# Patient Record
Sex: Male | Born: 1975 | Race: Black or African American | Hispanic: No | Marital: Single | Smoking: Current every day smoker
Health system: Southern US, Community
[De-identification: ages and names within clinical notes are randomized; demographics above are authoritative.]

## PROBLEM LIST (undated history)

## (undated) DIAGNOSIS — M545 Low back pain, unspecified: Secondary | ICD-10-CM

## (undated) DIAGNOSIS — G8929 Other chronic pain: Secondary | ICD-10-CM

## (undated) HISTORY — PX: HERNIA REPAIR: SHX51

---

## 2009-03-05 ENCOUNTER — Emergency Department (HOSPITAL_COMMUNITY): Admission: EM | Admit: 2009-03-05 | Discharge: 2009-03-05 | Payer: Self-pay | Admitting: Emergency Medicine

## 2009-03-31 ENCOUNTER — Emergency Department (HOSPITAL_COMMUNITY): Admission: EM | Admit: 2009-03-31 | Discharge: 2009-03-31 | Payer: Self-pay | Admitting: Emergency Medicine

## 2009-08-22 ENCOUNTER — Emergency Department (HOSPITAL_COMMUNITY): Admission: EM | Admit: 2009-08-22 | Discharge: 2009-08-22 | Payer: Self-pay | Admitting: Emergency Medicine

## 2009-09-06 ENCOUNTER — Emergency Department (HOSPITAL_COMMUNITY): Admission: EM | Admit: 2009-09-06 | Discharge: 2009-09-06 | Payer: Self-pay | Admitting: Emergency Medicine

## 2010-01-11 ENCOUNTER — Emergency Department (HOSPITAL_COMMUNITY): Admission: EM | Admit: 2010-01-11 | Discharge: 2010-01-11 | Payer: Self-pay | Admitting: Emergency Medicine

## 2010-02-14 ENCOUNTER — Emergency Department (HOSPITAL_COMMUNITY): Admission: EM | Admit: 2010-02-14 | Discharge: 2010-02-14 | Payer: Self-pay | Admitting: Emergency Medicine

## 2010-03-24 ENCOUNTER — Emergency Department (HOSPITAL_COMMUNITY)
Admission: EM | Admit: 2010-03-24 | Discharge: 2010-03-24 | Payer: Self-pay | Source: Home / Self Care | Admitting: Emergency Medicine

## 2011-07-25 ENCOUNTER — Emergency Department (HOSPITAL_COMMUNITY)
Admission: EM | Admit: 2011-07-25 | Discharge: 2011-07-25 | Disposition: A | Discharge status: Home / Self Care | Payer: Self-pay | Attending: Emergency Medicine | Admitting: Emergency Medicine

## 2011-07-25 ENCOUNTER — Encounter (HOSPITAL_COMMUNITY): Payer: Self-pay | Admitting: *Deleted

## 2011-07-25 DIAGNOSIS — R5383 Other fatigue: Secondary | ICD-10-CM | POA: Insufficient documentation

## 2011-07-25 DIAGNOSIS — H9209 Otalgia, unspecified ear: Secondary | ICD-10-CM | POA: Insufficient documentation

## 2011-07-25 DIAGNOSIS — R05 Cough: Secondary | ICD-10-CM | POA: Insufficient documentation

## 2011-07-25 DIAGNOSIS — K089 Disorder of teeth and supporting structures, unspecified: Secondary | ICD-10-CM | POA: Insufficient documentation

## 2011-07-25 DIAGNOSIS — F17200 Nicotine dependence, unspecified, uncomplicated: Secondary | ICD-10-CM | POA: Insufficient documentation

## 2011-07-25 DIAGNOSIS — J3489 Other specified disorders of nose and nasal sinuses: Secondary | ICD-10-CM | POA: Insufficient documentation

## 2011-07-25 DIAGNOSIS — R6889 Other general symptoms and signs: Secondary | ICD-10-CM | POA: Insufficient documentation

## 2011-07-25 DIAGNOSIS — IMO0001 Reserved for inherently not codable concepts without codable children: Secondary | ICD-10-CM | POA: Insufficient documentation

## 2011-07-25 DIAGNOSIS — Z79899 Other long term (current) drug therapy: Secondary | ICD-10-CM | POA: Insufficient documentation

## 2011-07-25 DIAGNOSIS — R5381 Other malaise: Secondary | ICD-10-CM | POA: Insufficient documentation

## 2011-07-25 DIAGNOSIS — R6883 Chills (without fever): Secondary | ICD-10-CM | POA: Insufficient documentation

## 2011-07-25 DIAGNOSIS — K0889 Other specified disorders of teeth and supporting structures: Secondary | ICD-10-CM

## 2011-07-25 DIAGNOSIS — R059 Cough, unspecified: Secondary | ICD-10-CM | POA: Insufficient documentation

## 2011-07-25 LAB — BASIC METABOLIC PANEL
CO2: 22 mEq/L (ref 19–32)
Calcium: 8.7 mg/dL (ref 8.4–10.5)
Creatinine, Ser: 1.03 mg/dL (ref 0.50–1.35)
GFR calc non Af Amer: >90 mL/min (ref >90)
Glucose, Bld: 98 mg/dL (ref 70–99)

## 2011-07-25 LAB — CBC
MCH: 22.9 pg — ABNORMAL LOW (ref 26.0–34.0)
MCHC: 33.5 g/dL (ref 30.0–36.0)
MCV: 68.4 fL — ABNORMAL LOW (ref 78.0–100.0)
Platelets: 205 10*3/uL (ref 150–400)
RBC: 5.45 MIL/uL (ref 4.22–5.81)

## 2011-07-25 LAB — POCT I-STAT, CHEM 8
Creatinine, Ser: 1.2 mg/dL (ref 0.50–1.35)
Glucose, Bld: 98 mg/dL (ref 70–99)
HCT: 43 % (ref 39.0–52.0)
Hemoglobin: 14.6 g/dL (ref 13.0–17.0)
Sodium: 140 mEq/L (ref 135–145)
TCO2: 27 mmol/L (ref 0–100)

## 2011-07-25 MED ORDER — ONDANSETRON HCL 4 MG PO TABS: 4.0000 mg | ORAL_TABLET | Freq: Four times a day (QID) | ORAL | Status: AC

## 2011-07-25 MED ORDER — SODIUM CHLORIDE 0.9 % IV BOLUS (SEPSIS)
1000.0000 mL | Freq: Once | INTRAVENOUS | Status: AC
  Administered 2011-07-25: 1000 mL via INTRAVENOUS

## 2011-07-25 MED ORDER — OXYCODONE-ACETAMINOPHEN 5-325 MG PO TABS
1.0000 | ORAL_TABLET | Freq: Once | ORAL | Status: AC
  Administered 2011-07-25: 1 via ORAL
  Filled 2011-07-25: qty 1

## 2011-07-25 MED ORDER — ONDANSETRON HCL 4 MG/2ML IJ SOLN
4.0000 mg | Freq: Once | INTRAMUSCULAR | Status: AC
  Administered 2011-07-25: 4 mg via INTRAVENOUS
  Filled 2011-07-25: qty 2

## 2011-07-25 MED ORDER — DIPHENOXYLATE-ATROPINE 2.5-0.025 MG PO TABS: 1.0000 | ORAL_TABLET | Freq: Four times a day (QID) | ORAL | Status: AC | PRN

## 2011-07-25 MED ORDER — ACETAMINOPHEN 325 MG PO TABS
650.0000 mg | ORAL_TABLET | Freq: Once | ORAL | Status: AC
  Administered 2011-07-25: 650 mg via ORAL
  Filled 2011-07-25: qty 2

## 2011-07-25 MED ORDER — OXYCODONE-ACETAMINOPHEN 5-325 MG PO TABS: 2.0000 | ORAL_TABLET | ORAL | Status: AC | PRN

## 2011-07-25 MED ORDER — PENICILLIN V POTASSIUM 500 MG PO TABS: 500.0000 mg | ORAL_TABLET | Freq: Three times a day (TID) | ORAL | Status: AC

## 2011-07-25 MED ORDER — DIPHENOXYLATE-ATROPINE 2.5-0.025 MG PO TABS
1.0000 | ORAL_TABLET | Freq: Once | ORAL | Status: AC
  Administered 2011-07-25: 1 via ORAL
  Filled 2011-07-25: qty 1

## 2011-07-25 NOTE — ED Notes (Signed)
Pt stated that he has been having n/v/d x 1 week. He is also complaining of body aches and fever. Pt stated that he [facility] and they diagnosed with tachycardia. No respiratory or cardiac distress. Will continue to monitor.

## 2011-07-25 NOTE — ED Notes (Signed)
The pt has had  2 liters of iv fluid and is ready for c/c according to he fnp

## 2011-07-25 NOTE — Discharge Instructions (Signed)
Mr. Haubner he had flulike symptoms today. We treated your dehydration with fluid in the ER today we treated her nausea with Zofran. We treated sure diarrhea with Lomotil. Giving you prescriptions for all this medication. New PCP from the list below to followup with. Turned to the ER for uncontrolled fever nausea vomiting and diarrhea.  RESOURCE GUIDE  Dental Problems  Patients with Medicaid: [facility] [phone]                                           [phone] [address] [employer] Phone:  [phone]                                                  Phone:  [phone]  If unable to pay or uninsured, contact:  [facility] or [facility]. to become qualified for the adult dental clinic.  Chronic Pain Problems Contact [facility]  [phone] Patients need to be referred by their primary care doctor.  Insufficient Money for Medicine Contact [employer]:  call "211" or [facility] [phone].  No Primary Care Doctor Call [facility]  [phone] Other agencies that provide inexpensive medical care    [facility]  [phone]    [facility]  [phone]    [facility]  [phone]    [facility]  [phone]    [facility]  [phone]    [facility]  [phone]  Psychological Services [facility]  [phone] [facility]  [phone] [facility]   [phone] (emergency services [phone])  Substance Abuse Resources [facility]  [phone] [facility] [phone] [facility] [place] [phone] Floydene Flock [phone] Residential & Outpatient Substance Abuse Program  [phone]  Abuse/Neglect [facility] Child Abuse Hotline [phone] [facility] Child Abuse Hotline [phone] (After Hours)  Emergency Shelter [facility] [phone]  Maternity Homes Room at the [place] [facility] [phone] [facility] Services [phone]  MRSA Hotline #:   248-594-4641    [facility] Resources  [facility]     [employer]                          [facility] Dept. [address] [place]                       [address]      [address]  [place]                                                Cristobal Goldmann Phone:  [phone]  Phone:  [phone]                 Phone:  [phone]  [facility] Phone:  [phone]  [facility] Child Abuse Hotline ([phone] (After Hours)

## 2011-07-25 NOTE — ED Notes (Signed)
Pt reports that he has had N/V/D x 1 week with cough.  Reports body aches.  Denies fever.  Pt went to health dept and was told that he had tachycardia.

## 2011-07-25 NOTE — ED Notes (Signed)
Pt sleeping. 

## 2011-07-26 NOTE — ED Provider Notes (Signed)
History     CSN: [id number]  Arrival date & time 07/25/11  1451   First MD Initiated Contact with Patient 07/25/11 1807      Chief Complaint  Patient presents with  . Emesis    (Consider location/radiation/quality/duration/timing/severity/associated sxs/prior treatment) Patient is a 36 y.o. male presenting with vomiting.  Emesis  This is a new problem. The current episode started more than 2 days ago. The problem occurs 5 to 10 times per day. The problem has not changed since onset.The emesis has an appearance of stomach contents. There has been no fever. Associated symptoms include chills, cough, diarrhea, headaches and sweats. Pertinent negatives include no abdominal pain and no fever. Risk factors include ill contacts.  Reports n/v/d for > 1 week.  Has taken theraflu, robitussin,and alka selzer with no relief.  Also c/o L upper incisor tooth pain x 3 days.  No pcp.  Sick contacts at home.   History reviewed. No pertinent past medical history.  History reviewed. No pertinent past surgical history.  History reviewed. No pertinent family history.  History  Substance Use Topics  . Smoking status: Current Everyday Smoker -- 2.0 packs/day  . Smokeless tobacco: Not on file  . Alcohol Use: Yes      Review of Systems  Constitutional: Positive for chills and fatigue. Negative for fever.  HENT: Positive for ear pain, congestion and rhinorrhea. Negative for neck pain and neck stiffness.   Eyes: Negative.   Respiratory: Positive for cough.   Cardiovascular: Negative for chest pain.  Gastrointestinal: Positive for vomiting and diarrhea. Negative for abdominal pain and blood in stool.  Genitourinary: Negative for difficulty urinating.  Musculoskeletal: Negative for back pain.  Neurological: Positive for headaches. Negative for dizziness and light-headedness.  Psychiatric/Behavioral: Negative.     Allergies  Review of patient's allergies indicates no known allergies.  Home  Medications   Current Outpatient Rx  Name Route Sig Dispense Refill  . ALLERGY PO Oral Take 1 tablet by mouth daily as needed. For allergies.    Marland Kitchen GUAIFENESIN 100 MG/5ML PO LIQD Oral Take 200 mg by mouth 3 (three) times daily as needed. For cough.    Juanita Laster COLD & COUGH PO Oral Take 10 mLs by mouth daily as needed. For cold symptoms.    Marland Kitchen DIPHENOXYLATE-ATROPINE 2.5-0.025 MG PO TABS Oral Take 1 tablet by mouth 4 (four) times daily as needed for diarrhea or loose stools. 30 tablet 0  . ONDANSETRON HCL 4 MG PO TABS Oral Take 1 tablet (4 mg total) by mouth every 6 (six) hours. 12 tablet 0  . OXYCODONE-ACETAMINOPHEN 5-325 MG PO TABS Oral Take 2 tablets by mouth every 4 (four) hours as needed for pain. 15 tablet 0  . PENICILLIN V POTASSIUM 500 MG PO TABS Oral Take 1 tablet (500 mg total) by mouth 3 (three) times daily. 30 tablet 0    BP 116/72  Pulse 82  Temp(Src) 98.9 F (37.2 C) (Oral)  Resp 18  SpO2 98%  Physical Exam  Nursing note and vitals reviewed. Constitutional: He is oriented to person, place, and time. He appears well-developed and well-nourished.  HENT:  Head: Normocephalic.  Eyes: Conjunctivae and EOM are normal. Pupils are equal, round, and reactive to light.  Neck: Normal range of motion. Neck supple.  Cardiovascular: Normal rate.   Pulmonary/Chest: Effort normal.  Abdominal: Soft. Bowel sounds are normal. He exhibits no distension. There is no tenderness. There is no rebound and no guarding.  Musculoskeletal: Normal range of  motion. He exhibits tenderness.       General body aches on assessment  Neurological: He is alert and oriented to person, place, and time.  Skin: Skin is warm and dry. No rash noted.  Psychiatric: He has a normal mood and affect.    ED Course  Procedures (including critical care time)  Labs Reviewed  CBC - Abnormal; Notable for the following:    Hemoglobin 12.5 (*)    HCT 37.3 (*)    MCV 68.4 (*)    MCH 22.9 (*)    All other components  within normal limits  POCT I-STAT, CHEM 8 - Abnormal; Notable for the following:    Calcium, Ion 1.08 (*)    All other components within normal limits  BASIC METABOLIC PANEL  LAB REPORT - SCANNED   No results found.   1. Flu-like symptoms   2. Toothache       MDM  N/V/D x 1 week with toothache.  1L NS in the ER with zofran and lomotil.  Tylenol for pain.  Rx for oxycodone and pcn lomotil and zofran.   Get pcp to follow up with or return here. Feels better and ready for discharge.         Jethro Bastos, NP 07/26/11 [phone]

## 2011-07-26 NOTE — ED Provider Notes (Signed)
Medical screening examination/treatment/procedure(s) were performed by non-physician practitioner and as supervising physician I was immediately available for consultation/collaboration.  Toy Baker, MD 07/26/11 [phone]

## 2013-12-22 ENCOUNTER — Emergency Department (HOSPITAL_COMMUNITY): Payer: Self-pay

## 2013-12-22 ENCOUNTER — Emergency Department (HOSPITAL_COMMUNITY)
Admission: EM | Admit: 2013-12-22 | Discharge: 2013-12-22 | Disposition: A | Discharge status: Home / Self Care | Payer: Self-pay | Attending: Emergency Medicine | Admitting: Emergency Medicine

## 2013-12-22 ENCOUNTER — Encounter (HOSPITAL_COMMUNITY): Payer: Self-pay | Admitting: Emergency Medicine

## 2013-12-22 DIAGNOSIS — G8929 Other chronic pain: Secondary | ICD-10-CM | POA: Insufficient documentation

## 2013-12-22 DIAGNOSIS — M545 Low back pain, unspecified: Secondary | ICD-10-CM | POA: Insufficient documentation

## 2013-12-22 DIAGNOSIS — Z87828 Personal history of other (healed) physical injury and trauma: Secondary | ICD-10-CM | POA: Insufficient documentation

## 2013-12-22 DIAGNOSIS — F17200 Nicotine dependence, unspecified, uncomplicated: Secondary | ICD-10-CM | POA: Insufficient documentation

## 2013-12-22 DIAGNOSIS — M79609 Pain in unspecified limb: Secondary | ICD-10-CM | POA: Insufficient documentation

## 2013-12-22 HISTORY — DX: Low back pain, unspecified: M54.50

## 2013-12-22 HISTORY — DX: Other chronic pain: G89.29

## 2013-12-22 HISTORY — DX: Low back pain: M54.5

## 2013-12-22 LAB — URINALYSIS, ROUTINE W REFLEX MICROSCOPIC
Bilirubin Urine: NEGATIVE
GLUCOSE, UA: NEGATIVE mg/dL
HGB URINE DIPSTICK: NEGATIVE
Ketones, ur: NEGATIVE mg/dL
LEUKOCYTES UA: NEGATIVE
Nitrite: NEGATIVE
Protein, ur: NEGATIVE mg/dL
SPECIFIC GRAVITY, URINE: 1.016 (ref 1.005–1.030)
UROBILINOGEN UA: 1 mg/dL (ref 0.0–1.0)
pH: 6.5 (ref 5.0–8.0)

## 2013-12-22 LAB — CBC WITH DIFFERENTIAL/PLATELET
BASOS PCT: 1 % (ref 0–1)
Basophils Absolute: 0.1 10*3/uL (ref 0.0–0.1)
EOS PCT: 6 % — AB (ref 0–5)
Eosinophils Absolute: 0.3 10*3/uL (ref 0.0–0.7)
HEMATOCRIT: 37.3 % — AB (ref 39.0–52.0)
HEMOGLOBIN: 13 g/dL (ref 13.0–17.0)
LYMPHS ABS: 1.3 10*3/uL (ref 0.7–4.0)
LYMPHS PCT: 26 % (ref 12–46)
MCH: 23.9 pg — ABNORMAL LOW (ref 26.0–34.0)
MCHC: 34.9 g/dL (ref 30.0–36.0)
MCV: 68.7 fL — AB (ref 78.0–100.0)
MONOS PCT: 7 % (ref 3–12)
Monocytes Absolute: 0.4 10*3/uL (ref 0.1–1.0)
NEUTROS ABS: 3 10*3/uL (ref 1.7–7.7)
Neutrophils Relative %: 60 % (ref 43–77)
Platelets: 289 10*3/uL (ref 150–400)
RBC: 5.43 MIL/uL (ref 4.22–5.81)
RDW: 14.7 % (ref 11.5–15.5)
Smear Review: ADEQUATE
WBC: 5.1 10*3/uL (ref 4.0–10.5)

## 2013-12-22 LAB — BASIC METABOLIC PANEL
Anion gap: 14 (ref 5–15)
BUN: 11 mg/dL (ref 6–23)
CHLORIDE: 99 meq/L (ref 96–112)
CO2: 28 meq/L (ref 19–32)
Calcium: 8.9 mg/dL (ref 8.4–10.5)
Creatinine, Ser: 1.08 mg/dL (ref 0.50–1.35)
GFR calc Af Amer: >90 mL/min (ref >90)
GFR calc non Af Amer: 84 mL/min — ABNORMAL LOW (ref >90)
GLUCOSE: 99 mg/dL (ref 70–99)
POTASSIUM: 3.5 meq/L — AB (ref 3.7–5.3)
SODIUM: 141 meq/L (ref 137–147)

## 2013-12-22 MED ORDER — SODIUM CHLORIDE 0.9 % IV BOLUS (SEPSIS)
1000.0000 mL | Freq: Once | INTRAVENOUS | Status: AC
  Administered 2013-12-22: 1000 mL via INTRAVENOUS

## 2013-12-22 MED ORDER — MORPHINE SULFATE 4 MG/ML IJ SOLN
6.0000 mg | Freq: Once | INTRAMUSCULAR | Status: AC
  Administered 2013-12-22: 6 mg via INTRAVENOUS
  Filled 2013-12-22: qty 2

## 2013-12-22 MED ORDER — HYDROCODONE-ACETAMINOPHEN 5-325 MG PO TABS: 1.0000 | ORAL_TABLET | Freq: Two times a day (BID) | ORAL | Status: DC | PRN

## 2013-12-22 MED ORDER — HYDROCODONE-ACETAMINOPHEN 5-325 MG PO TABS
2.0000 | ORAL_TABLET | Freq: Once | ORAL | Status: AC
  Administered 2013-12-22: 2 via ORAL
  Filled 2013-12-22: qty 2
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
## 2013-12-22 NOTE — ED Notes (Addendum)
Pt still in room, slowly getting dressed

## 2013-12-22 NOTE — Discharge Instructions (Signed)
Back Pain, Adult  Jordan Rush, you were seen for worsening of your chronic back pain.  You can take pain medication at home as needed and follow up with orthopaedic surgery for continued care.  If your symptoms worsen, or you develop numbness/tingling, or you urinate/have bowel movements on yourself, return to the ED immediately for repeat evaluation.  Thank you. Back pain is very common. The pain often gets better over time. The cause of back pain is usually not dangerous. Most people can learn to manage their back pain on their own.  HOME CARE   Stay active. Start with short walks on flat ground if you can. Try to walk farther each day.  Do not sit, drive, or stand in one place for more than 30 minutes. Do not stay in bed.  Do not avoid exercise or work. Activity can help your back heal faster.  Be careful when you bend or lift an object. Bend at your knees, keep the object close to you, and do not twist.  Sleep on a firm mattress. Lie on your side, and bend your knees. If you lie on your back, put a pillow under your knees.  Only take medicines as told by your doctor.  Put ice on the injured area.  Put ice in a plastic bag.  Place a towel between your skin and the bag.  Leave the ice on for 15-20 minutes, 03-04 times a day for the first 2 to 3 days. After that, you can switch between ice and heat packs.  Ask your doctor about back exercises or massage.  Avoid feeling anxious or stressed. Find good ways to deal with stress, such as exercise. GET HELP RIGHT AWAY IF:   Your pain does not go away with rest or medicine.  Your pain does not go away in 1 week.  You have new problems.  You do not feel well.  The pain spreads into your legs.  You cannot control when you poop (bowel movement) or pee (urinate).  Your arms or legs feel weak or lose feeling (numbness).  You feel sick to your stomach (nauseous) or throw up (vomit).  You have belly (abdominal) pain.  You feel  like you may pass out (faint). MAKE SURE YOU:   Understand these instructions.  Will watch your condition.  Will get help right away if you are not doing well or get worse. Document Released: 09/28/2007 Document Revised: 07/04/2011 Document Reviewed: 08/13/2013 [facility] Patient Information 2015 [name], [place]. This information is not intended to replace advice given to you by your health care provider. Make sure you discuss any questions you have with your health care provider.   Back Injury Prevention The following tips can help you to prevent a back injury. PHYSICAL FITNESS  Exercise often. Try to develop strong stomach (abdominal) muscles.  Do aerobic exercises often. This includes walking, jogging, biking, swimming.  Do exercises that help with balance and strength often. This includes tai chi and yoga.  Stretch before and after you exercise.  Keep a healthy weight. DIET   Ask your doctor how much calcium and vitamin D you need every day.  Include calcium in your diet. Foods high in calcium include dairy products; green, leafy vegetables; and products with calcium added (fortified).  Include vitamin D in your diet. Foods high in vitamin D include milk and products with vitamin D added.  Think about taking a multivitamin or other nutritional products called " supplements."  Stop smoking if you  smoke. POSTURE   Sit and stand up straight. Avoid leaning forward or hunching over.  Choose chairs that support your lower back.  If you work at a desk:  Sit close to your work so you do not lean over.  Keep your chin tucked in.  Keep your neck drawn back.  Keep your elbows bent at a right angle. Your arms should look like the letter "L."  Sit high and close to the steering wheel when you drive. Add low back support to your car seat if needed.  Avoid sitting or standing in one position for too long. Get up and move around every hour. Take breaks if you are driving for a  long time.  Sleep on your side with your knees slightly bent. You can also sleep on your back with a pillow under your knees. Do not sleep on your stomach. LIFTING, TWISTING, AND REACHING  Avoid heavy lifting, especially lifting over and over again. If you must do heavy lifting:  Stretch before lifting.  Work slowly.  Rest between lifts.  Use carts and dollies to move objects when possible.  Make several small trips instead of carrying 1 heavy load.  Ask for help when you need it.  Ask for help when moving big, awkward objects.  Follow these steps when lifting:  Stand with your feet shoulder-width apart.  Get as close to the object as you can. Do not pick up heavy objects that are far from your body.  Use handles or lifting straps when possible.  Bend at your knees. Squat down, but keep your heels off the floor.  Keep your shoulders back, your chin tucked in, and your back straight.  Lift the object slowly. Tighten the muscles in your legs, stomach, and butt. Keep the object as close to the center of your body as possible.  Reverse these directions when you put a load down.  Do not:  Lift the object above your waist.  Twist at the waist while lifting or carrying a load. Move your feet if you need to turn, not your waist.  Bend over without bending at your knees.  Avoid reaching over your head, across a table, or for an object on a high surface. OTHER TIPS  Avoid wet floors and keep sidewalks clear of ice.  Do not sleep on a mattress that is too soft or too hard.  Keep items that you use often within easy reach.  Put heavier objects on shelves at waist level. Put lighter objects on lower or higher shelves.  Find ways to lessen your stress. You can try exercise, massage, or relaxation.  Get help for depression or anxiety if needed. GET HELP IF:  You injure your back.  You have questions about diet, exercise, or other ways to prevent back injuries. MAKE  SURE YOU:  Understand these instructions.  Will watch your condition.  Will get help right away if you are not doing well or get worse. Document Released: 09/28/2007 Document Revised: 07/04/2011 Document Reviewed: 05/23/2011 [facility] Patient Information 2015 [place], [place]. This information is not intended to replace advice given to you by your health care provider. Make sure you discuss any questions you have with your health care provider.
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
## 2015-06-12 ENCOUNTER — Encounter (HOSPITAL_COMMUNITY): Payer: Self-pay | Admitting: *Deleted

## 2015-06-12 ENCOUNTER — Emergency Department (HOSPITAL_COMMUNITY)
Admission: EM | Admit: 2015-06-12 | Discharge: 2015-06-12 | Disposition: A | Discharge status: Home / Self Care | Payer: Self-pay | Attending: Emergency Medicine | Admitting: Emergency Medicine

## 2015-06-12 ENCOUNTER — Emergency Department (HOSPITAL_COMMUNITY): Payer: Self-pay

## 2015-06-12 DIAGNOSIS — F17200 Nicotine dependence, unspecified, uncomplicated: Secondary | ICD-10-CM | POA: Insufficient documentation

## 2015-06-12 DIAGNOSIS — R69 Illness, unspecified: Secondary | ICD-10-CM

## 2015-06-12 DIAGNOSIS — J111 Influenza due to unidentified influenza virus with other respiratory manifestations: Secondary | ICD-10-CM

## 2015-06-12 DIAGNOSIS — R109 Unspecified abdominal pain: Secondary | ICD-10-CM | POA: Insufficient documentation

## 2015-06-12 DIAGNOSIS — R1110 Vomiting, unspecified: Secondary | ICD-10-CM | POA: Insufficient documentation

## 2015-06-12 DIAGNOSIS — J029 Acute pharyngitis, unspecified: Secondary | ICD-10-CM | POA: Insufficient documentation

## 2015-06-12 DIAGNOSIS — R51 Headache: Secondary | ICD-10-CM | POA: Insufficient documentation

## 2015-06-12 DIAGNOSIS — G8929 Other chronic pain: Secondary | ICD-10-CM | POA: Insufficient documentation

## 2015-06-12 DIAGNOSIS — R05 Cough: Secondary | ICD-10-CM | POA: Insufficient documentation

## 2015-06-12 DIAGNOSIS — Z87828 Personal history of other (healed) physical injury and trauma: Secondary | ICD-10-CM | POA: Insufficient documentation

## 2015-06-12 LAB — CBC WITH DIFFERENTIAL/PLATELET
BASOS ABS: 0 10*3/uL (ref 0.0–0.1)
Basophils Relative: 1 %
EOS ABS: 0 10*3/uL (ref 0.0–0.7)
Eosinophils Relative: 0 %
HCT: 38.5 % — ABNORMAL LOW (ref 39.0–52.0)
HEMOGLOBIN: 12.8 g/dL — AB (ref 13.0–17.0)
LYMPHS PCT: 24 %
Lymphs Abs: 1 10*3/uL (ref 0.7–4.0)
MCH: 22.7 pg — ABNORMAL LOW (ref 26.0–34.0)
MCHC: 33.2 g/dL (ref 30.0–36.0)
MCV: 68.1 fL — ABNORMAL LOW (ref 78.0–100.0)
MONO ABS: 0.4 10*3/uL (ref 0.1–1.0)
Monocytes Relative: 10 %
NEUTROS PCT: 65 %
Neutro Abs: 2.7 10*3/uL (ref 1.7–7.7)
PLATELETS: 257 10*3/uL (ref 150–400)
RBC: 5.65 MIL/uL (ref 4.22–5.81)
RDW: 14.5 % (ref 11.5–15.5)
WBC: 4.1 10*3/uL (ref 4.0–10.5)

## 2015-06-12 LAB — COMPREHENSIVE METABOLIC PANEL
ALK PHOS: 49 U/L (ref 38–126)
ALT: 16 U/L — ABNORMAL LOW (ref 17–63)
ANION GAP: 9 (ref 5–15)
AST: 24 U/L (ref 15–41)
Albumin: 3.8 g/dL (ref 3.5–5.0)
BUN: 10 mg/dL (ref 6–20)
CALCIUM: 9.3 mg/dL (ref 8.9–10.3)
CO2: 27 mmol/L (ref 22–32)
CREATININE: 1.11 mg/dL (ref 0.61–1.24)
Chloride: 101 mmol/L (ref 101–111)
Glucose, Bld: 102 mg/dL — ABNORMAL HIGH (ref 65–99)
Potassium: 4.2 mmol/L (ref 3.5–5.1)
SODIUM: 137 mmol/L (ref 135–145)
TOTAL PROTEIN: 6.7 g/dL (ref 6.5–8.1)
Total Bilirubin: 0.7 mg/dL (ref 0.3–1.2)

## 2015-06-12 LAB — LIPASE, BLOOD: LIPASE: 27 U/L (ref 11–51)

## 2015-06-12 LAB — RAPID STREP SCREEN (MED CTR MEBANE ONLY): STREPTOCOCCUS, GROUP A SCREEN (DIRECT): NEGATIVE

## 2015-06-12 MED ORDER — BENZONATATE 100 MG PO CAPS: 100.0000 mg | ORAL_CAPSULE | Freq: Three times a day (TID) | ORAL | Status: DC

## 2015-06-12 MED ORDER — NAPROXEN 500 MG PO TABS: 500.0000 mg | ORAL_TABLET | Freq: Two times a day (BID) | ORAL | Status: DC

## 2015-06-12 MED ORDER — SODIUM CHLORIDE 0.9 % IV BOLUS (SEPSIS)
1000.0000 mL | Freq: Once | INTRAVENOUS | Status: AC
  Administered 2015-06-12: 1000 mL via INTRAVENOUS

## 2015-06-12 MED ORDER — KETOROLAC TROMETHAMINE 30 MG/ML IJ SOLN
30.0000 mg | Freq: Once | INTRAMUSCULAR | Status: AC
  Administered 2015-06-12: 30 mg via INTRAVENOUS
  Filled 2015-06-12: qty 1

## 2015-06-12 MED ORDER — HYDROCODONE-ACETAMINOPHEN 5-325 MG PO TABS
1.0000 | ORAL_TABLET | ORAL | Status: AC
  Administered 2015-06-12: 1 via ORAL
  Filled 2015-06-12: qty 1

## 2015-06-12 MED ORDER — ACETAMINOPHEN 325 MG PO TABS
650.0000 mg | ORAL_TABLET | Freq: Once | ORAL | Status: AC
  Administered 2015-06-12: 650 mg via ORAL
  Filled 2015-06-12: qty 2

## 2015-06-12 NOTE — Discharge Instructions (Signed)
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
## 2015-06-12 NOTE — ED Notes (Signed)
The pt is c/o of cold cough  With a headache abd pain vomting unknown temp symptoms for 2 days last advil was yesterday none today

## 2015-06-15 LAB — CULTURE, GROUP A STREP (THRC)

## 2015-11-08 ENCOUNTER — Encounter (HOSPITAL_COMMUNITY): Payer: Self-pay

## 2015-11-08 ENCOUNTER — Emergency Department (HOSPITAL_COMMUNITY): Payer: No Typology Code available for payment source

## 2015-11-08 DIAGNOSIS — Y999 Unspecified external cause status: Secondary | ICD-10-CM | POA: Insufficient documentation

## 2015-11-08 DIAGNOSIS — F17200 Nicotine dependence, unspecified, uncomplicated: Secondary | ICD-10-CM | POA: Diagnosis not present

## 2015-11-08 DIAGNOSIS — Z79899 Other long term (current) drug therapy: Secondary | ICD-10-CM | POA: Insufficient documentation

## 2015-11-08 DIAGNOSIS — M79644 Pain in right finger(s): Secondary | ICD-10-CM | POA: Insufficient documentation

## 2015-11-08 DIAGNOSIS — G8929 Other chronic pain: Secondary | ICD-10-CM | POA: Insufficient documentation

## 2015-11-08 DIAGNOSIS — Y92410 Unspecified street and highway as the place of occurrence of the external cause: Secondary | ICD-10-CM | POA: Insufficient documentation

## 2015-11-08 DIAGNOSIS — M545 Low back pain: Secondary | ICD-10-CM | POA: Diagnosis not present

## 2015-11-08 DIAGNOSIS — Y939 Activity, unspecified: Secondary | ICD-10-CM | POA: Insufficient documentation

## 2015-11-08 DIAGNOSIS — M25531 Pain in right wrist: Secondary | ICD-10-CM | POA: Insufficient documentation

## 2015-11-08 MED ORDER — OXYCODONE-ACETAMINOPHEN 5-325 MG PO TABS
ORAL_TABLET | ORAL | Status: AC
  Administered 2015-11-08: 1 via ORAL
  Filled 2015-11-08: qty 1

## 2015-11-08 MED ORDER — OXYCODONE-ACETAMINOPHEN 5-325 MG PO TABS
1.0000 | ORAL_TABLET | ORAL | Status: DC | PRN
  Administered 2015-11-08: 1 via ORAL
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
## 2015-11-09 ENCOUNTER — Emergency Department (HOSPITAL_COMMUNITY)
Admission: EM | Admit: 2015-11-09 | Discharge: 2015-11-09 | Disposition: A | Discharge status: Home / Self Care | Payer: No Typology Code available for payment source | Attending: Emergency Medicine | Admitting: Emergency Medicine

## 2015-11-09 MED ORDER — LIDOCAINE 5 % EX PTCH
1.0000 | MEDICATED_PATCH | Freq: Once | CUTANEOUS | Status: DC
  Administered 2015-11-09: 1 via TRANSDERMAL
  Filled 2015-11-09: qty 1

## 2015-11-09 MED ORDER — HYDROCODONE-ACETAMINOPHEN 5-325 MG PO TABS
2.0000 | ORAL_TABLET | Freq: Once | ORAL | Status: AC
  Administered 2015-11-09: 2 via ORAL
  Filled 2015-11-09: qty 2

## 2015-11-09 MED ORDER — KETOROLAC TROMETHAMINE 60 MG/2ML IM SOLN
60.0000 mg | Freq: Once | INTRAMUSCULAR | Status: AC
  Administered 2015-11-09: 60 mg via INTRAMUSCULAR
  Filled 2015-11-09: qty 2
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
## 2021-12-21 ENCOUNTER — Encounter (HOSPITAL_COMMUNITY): Payer: Self-pay | Admitting: *Deleted

## 2021-12-21 ENCOUNTER — Ambulatory Visit (HOSPITAL_COMMUNITY)
Admission: EM | Admit: 2021-12-21 | Discharge: 2021-12-21 | Disposition: A | Discharge status: Home / Self Care | Payer: Self-pay | Attending: Family Medicine | Admitting: Family Medicine

## 2021-12-21 DIAGNOSIS — R197 Diarrhea, unspecified: Secondary | ICD-10-CM

## 2021-12-21 DIAGNOSIS — K439 Ventral hernia without obstruction or gangrene: Secondary | ICD-10-CM

## 2021-12-21 DIAGNOSIS — R112 Nausea with vomiting, unspecified: Secondary | ICD-10-CM

## 2021-12-21 MED ORDER — ONDANSETRON HCL 4 MG PO TABS: 4.0000 mg | ORAL_TABLET | Freq: Four times a day (QID) | ORAL | 0 refills | Status: DC

## 2021-12-21 MED ORDER — ONDANSETRON HCL 4 MG/2ML IJ SOLN
INTRAMUSCULAR | Status: AC
  Filled 2021-12-21: qty 2

## 2021-12-21 MED ORDER — ONDANSETRON HCL 4 MG/2ML IJ SOLN
4.0000 mg | Freq: Once | INTRAMUSCULAR | Status: AC
  Administered 2021-12-21: 4 mg via INTRAMUSCULAR

## 2021-12-21 MED ORDER — FAMOTIDINE 40 MG PO TABS: 40.0000 mg | ORAL_TABLET | Freq: Every day | ORAL | 0 refills | Status: DC

## 2021-12-21 NOTE — Discharge Instructions (Addendum)
You were seen today for nausea, vomiting and diarrhea.  At this time I suspect this is viral.  I have given you a shot of nausea medication today.  I have sent a script to your pharmacy for nausea medication and medication to help with acid in your stomach.  Please drink small amounts of clear fluids at a times, such as sprite, ginger ale, or gatorade.  Eat small amounts of bland foods, such as soups, broths, crackers, etc until you are feeling better.  If not improving then please return or go to the ER for further evaluation.
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
## 2022-11-06 ENCOUNTER — Ambulatory Visit (INDEPENDENT_AMBULATORY_CARE_PROVIDER_SITE_OTHER): Payer: 59

## 2022-11-06 ENCOUNTER — Ambulatory Visit (HOSPITAL_COMMUNITY)
Admission: EM | Admit: 2022-11-06 | Discharge: 2022-11-06 | Disposition: A | Discharge status: Home / Self Care | Payer: 59 | Attending: Internal Medicine | Admitting: Internal Medicine

## 2022-11-06 ENCOUNTER — Encounter (HOSPITAL_COMMUNITY): Payer: Self-pay

## 2022-11-06 DIAGNOSIS — M5136 Other intervertebral disc degeneration, lumbar region: Secondary | ICD-10-CM | POA: Insufficient documentation

## 2022-11-06 DIAGNOSIS — R42 Dizziness and giddiness: Secondary | ICD-10-CM | POA: Diagnosis not present

## 2022-11-06 DIAGNOSIS — R55 Syncope and collapse: Secondary | ICD-10-CM | POA: Diagnosis not present

## 2022-11-06 DIAGNOSIS — M5431 Sciatica, right side: Secondary | ICD-10-CM | POA: Diagnosis not present

## 2022-11-06 LAB — CBC WITH DIFFERENTIAL/PLATELET
Abs Immature Granulocytes: 0.01 10*3/uL (ref 0.00–0.07)
Basophils Absolute: 0.1 10*3/uL (ref 0.0–0.1)
Basophils Relative: 2 %
Eosinophils Absolute: 0.2 10*3/uL (ref 0.0–0.5)
Eosinophils Relative: 5 %
HCT: 39.7 % (ref 39.0–52.0)
Hemoglobin: 13.1 g/dL (ref 13.0–17.0)
Immature Granulocytes: 0 %
Lymphocytes Relative: 37 %
Lymphs Abs: 1.2 10*3/uL (ref 0.7–4.0)
MCH: 23 pg — ABNORMAL LOW (ref 26.0–34.0)
MCHC: 33 g/dL (ref 30.0–36.0)
MCV: 69.8 fL — ABNORMAL LOW (ref 80.0–100.0)
Monocytes Absolute: 0.3 10*3/uL (ref 0.1–1.0)
Monocytes Relative: 11 %
Neutro Abs: 1.5 10*3/uL — ABNORMAL LOW (ref 1.7–7.7)
Neutrophils Relative %: 45 %
Platelets: 335 10*3/uL (ref 150–400)
RBC: 5.69 MIL/uL (ref 4.22–5.81)
RDW: 14.7 % (ref 11.5–15.5)
WBC: 3.2 10*3/uL — ABNORMAL LOW (ref 4.0–10.5)
nRBC: 0 % (ref 0.0–0.2)

## 2022-11-06 LAB — POCT URINALYSIS DIP (MANUAL ENTRY)
Bilirubin, UA: NEGATIVE
Blood, UA: NEGATIVE
Glucose, UA: NEGATIVE mg/dL
Leukocytes, UA: NEGATIVE
Nitrite, UA: NEGATIVE
Spec Grav, UA: 1.025 (ref 1.010–1.025)
Urobilinogen, UA: 1 E.U./dL
pH, UA: 6 (ref 5.0–8.0)

## 2022-11-06 LAB — COMPREHENSIVE METABOLIC PANEL
ALT: 31 U/L (ref 0–44)
AST: 35 U/L (ref 15–41)
Albumin: 3.6 g/dL (ref 3.5–5.0)
Alkaline Phosphatase: 86 U/L (ref 38–126)
Anion gap: 10 (ref 5–15)
BUN: 8 mg/dL (ref 6–20)
CO2: 28 mmol/L (ref 22–32)
Calcium: 9 mg/dL (ref 8.9–10.3)
Chloride: 101 mmol/L (ref 98–111)
Creatinine, Ser: 0.99 mg/dL (ref 0.61–1.24)
GFR, Estimated: >60 mL/min (ref >60)
Glucose, Bld: 57 mg/dL — ABNORMAL LOW (ref 70–99)
Potassium: 4.4 mmol/L (ref 3.5–5.1)
Sodium: 139 mmol/L (ref 135–145)
Total Bilirubin: 0.6 mg/dL (ref 0.3–1.2)
Total Protein: 6.8 g/dL (ref 6.5–8.1)

## 2022-11-06 LAB — POCT FASTING CBG KUC MANUAL ENTRY: POCT Glucose (KUC): 114 mg/dL — AB (ref 70–99)

## 2022-11-06 MED ORDER — LIDOCAINE 5 % EX PTCH: 1.0000 | MEDICATED_PATCH | CUTANEOUS | 0 refills | Status: AC

## 2022-11-06 MED ORDER — NAPROXEN 375 MG PO TABS: 375.0000 mg | ORAL_TABLET | Freq: Two times a day (BID) | ORAL | 0 refills | Status: AC

## 2022-11-06 MED ORDER — METHOCARBAMOL 500 MG PO TABS: 500.0000 mg | ORAL_TABLET | Freq: Two times a day (BID) | ORAL | 0 refills | Status: AC

## 2022-11-06 MED ORDER — KETOROLAC TROMETHAMINE 30 MG/ML IJ SOLN
INTRAMUSCULAR | Status: AC
  Filled 2022-11-06: qty 1

## 2022-11-06 MED ORDER — KETOROLAC TROMETHAMINE 30 MG/ML IJ SOLN: 30.0000 mg | Freq: Once | INTRAMUSCULAR | Status: DC

## 2022-11-06 NOTE — Discharge Instructions (Signed)
Your workup was normal.  Your EKG was reassuring.  Your blood sugar was okay.  Your vital signs were normal.  I will contact you if your lab work is abnormal.  Please make sure that you drink plenty of fluid and avoid being out in the heat for long periods of time.  Eat small frequent meals.  I would like you to follow-up with cardiology.  Call them to schedule an appointment.  I would also like you to have a primary care provider.  I have put you on the list and someone should call you to help schedule an appointment next week.  If you have any episodes of passing out or develop any additional symptoms you need to be seen immediately.  Your x-ray showed arthritis in your back.  Please take Naprosyn twice a day.  Do not take NSAIDs with this medication including aspirin, ibuprofen/Advil, naproxen/Aleve.  You can use lidocaine patches on specific areas that are bothersome.  Apply 1 patch and leave it on for 12 hours; remove it for minimum of 12 hours.  Use only 1 patch per 24 hours.  You can also use methocarbamol.  This will make you sleepy so do not drive or drink alcohol with taking it.  Use heat and gentle stretch for symptom relief.  Follow-up with orthopedics for further evaluation and management as you may benefit from more advanced imaging and/or physical therapy that we cannot arrange in urgent care.  If anything worsens and you have increasing pain, difficulty walking, numbness or tingling on the inside of your thighs, going to the bathroom on yourself without noticing it you need to be seen immediately.
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
## 2022-11-06 NOTE — ED Notes (Signed)
Labs collected.

## 2022-11-06 NOTE — ED Triage Notes (Addendum)
Patient c/o bilateral lower back pain that radiates into his buttocks x 2 months and worsening.  Patient states he has been taking Tylenol and Doan's back pain   Patient added during triage- I feel like my kidneys  and lungs are about to collapse. Patient denies any N/v/D.  Patient does acknowledge urinary frequency.

## 2023-05-14 ENCOUNTER — Emergency Department (HOSPITAL_COMMUNITY)
Admission: EM | Admit: 2023-05-14 | Discharge: 2023-05-14 | Disposition: A | Discharge status: Home / Self Care | Payer: 59 | Attending: Emergency Medicine | Admitting: Emergency Medicine

## 2023-05-14 ENCOUNTER — Encounter (HOSPITAL_COMMUNITY): Payer: Self-pay

## 2023-05-14 ENCOUNTER — Other Ambulatory Visit: Payer: Self-pay

## 2023-05-14 DIAGNOSIS — K0889 Other specified disorders of teeth and supporting structures: Secondary | ICD-10-CM | POA: Diagnosis present

## 2023-05-14 DIAGNOSIS — K029 Dental caries, unspecified: Secondary | ICD-10-CM

## 2023-05-14 DIAGNOSIS — K047 Periapical abscess without sinus: Secondary | ICD-10-CM | POA: Insufficient documentation

## 2023-05-14 MED ORDER — BUPIVACAINE HCL (PF) 0.25 % IJ SOLN
10.0000 mL | Freq: Once | INTRAMUSCULAR | Status: AC
  Administered 2023-05-14: 10 mL
  Filled 2023-05-14: qty 30

## 2023-05-14 MED ORDER — AMOXICILLIN-POT CLAVULANATE 875-125 MG PO TABS: 1.0000 | ORAL_TABLET | Freq: Two times a day (BID) | ORAL | 0 refills | Status: AC

## 2023-05-14 MED ORDER — AMOXICILLIN-POT CLAVULANATE 875-125 MG PO TABS
1.0000 | ORAL_TABLET | Freq: Once | ORAL | Status: AC
  Administered 2023-05-14: 1 via ORAL
  Filled 2023-05-14: qty 1

## 2023-05-14 MED ORDER — OXYCODONE-ACETAMINOPHEN 5-325 MG PO TABS: 1.0000 | ORAL_TABLET | Freq: Four times a day (QID) | ORAL | 0 refills | Status: AC | PRN

## 2023-05-14 MED ORDER — OXYCODONE-ACETAMINOPHEN 5-325 MG PO TABS
1.0000 | ORAL_TABLET | Freq: Once | ORAL | Status: AC
  Administered 2023-05-14: 1 via ORAL
  Filled 2023-05-14: qty 1

## 2023-05-14 NOTE — Discharge Instructions (Addendum)
Please use Tylenol or ibuprofen for pain.  You may use 600 mg ibuprofen every 6 hours or 1000 mg of Tylenol every 6 hours.  You may choose to alternate between the 2.  This would be most effective.  Not to exceed 4 g of Tylenol within 24 hours.  Not to exceed 3200 mg ibuprofen 24 hours.  You can use the stronger narcotic pain medication in place of Tylenol only for severe breakthrough pain. Please take the entire course of antibiotics, I recommend that you take them with some food on your stomach, you can take your second dose later tonight.  Please follow-up with the dentist as planned.

## 2023-05-14 NOTE — ED Provider Notes (Signed)
[place] [facility] Provider Note   CSN: [id number] Arrival date & time: 05/14/23  6578     History  Chief Complaint  Patient presents with   Dental Pain    Jordan Rush is a 48 y.o. male with past medical history significant for chronic back pain, who presents to concern for left lower dental pain, swelling.  Patient reports that having difficulty eating secondary to the pain and swelling.  He denies any difficulty swallowing.  He does report that it hurts to open his mouth.  He has an appointment with a dentist on February 2.  He denies any fever, chills.   Dental Pain      Home Medications Prior to Admission medications   Medication Sig Start Date End Date Taking? Authorizing Provider  oxyCODONE-acetaminophen (PERCOCET/ROXICET) 5-325 MG tablet Take 1 tablet by mouth every 6 (six) hours as needed for severe pain (pain score 7-10). 05/14/23  Yes Airis Barbee H, PA-C  lidocaine (LIDODERM) 5 % Place 1 patch onto the skin daily. Remove & Discard patch within 12 hours or as directed by MD 11/06/22   Raspet, Noberto Retort, PA-C  methocarbamol (ROBAXIN) 500 MG tablet Take 1 tablet (500 mg total) by mouth 2 (two) times daily. 11/06/22   Raspet, Noberto Retort, PA-C  naproxen (NAPROSYN) 375 MG tablet Take 1 tablet (375 mg total) by mouth 2 (two) times daily. 11/06/22   Raspet, Noberto Retort, PA-C      Allergies    Patient has no known allergies.    Review of Systems   Review of Systems  HENT:  Positive for dental problem.   All other systems reviewed and are negative.   Physical Exam Updated Vital Signs BP (!) 125/93 (BP Location: Right Arm)   Pulse 93   Temp 98.5 F (36.9 C)   Resp 16   SpO2 100%  Physical Exam Vitals and nursing note reviewed.  Constitutional:      General: He is not in acute distress.    Appearance: Normal appearance.  HENT:     Head: Normocephalic and atraumatic.     Mouth/Throat:     Comments: Patient with some soft tissue  swelling of the buccal mucosa, he has clear dental disease noted in the left lower jaw with broken teeth, cavities visible. Eyes:     General:        Right eye: No discharge.        Left eye: No discharge.  Cardiovascular:     Rate and Rhythm: Normal rate and regular rhythm.  Pulmonary:     Effort: Pulmonary effort is normal. No respiratory distress.  Musculoskeletal:        General: No deformity.  Skin:    General: Skin is warm and dry.  Neurological:     Mental Status: He is alert and oriented to person, place, and time.  Psychiatric:        Mood and Affect: Mood normal.        Behavior: Behavior normal.     ED Results / Procedures / Treatments   Labs (all labs ordered are listed, but only abnormal results are displayed) Labs Reviewed - No data to display  EKG None  Radiology No results found.  Procedures .Nerve Block  Date/Time: 05/14/2023 8:08 AM  Performed by: Olene Floss, PA-C Authorized by: Olene Floss, PA-C   Consent:    Consent obtained:  Verbal   Consent given by:  Patient  Risks, benefits, and alternatives were discussed: yes     Risks discussed:  Allergic reaction, infection, bleeding, intravenous injection, pain, nerve damage, swelling and unsuccessful block   Alternatives discussed:  No treatment Universal protocol:    Procedure explained and questions answered to patient or proxy's satisfaction: yes     Patient identity confirmed:  Verbally with patient Indications:    Indications:  Pain relief Location:    Body area:  Head   Laterality:  Left Skin anesthesia:    Skin anesthesia method:  None Procedure details:    Block needle gauge:  27 G   Anesthetic injected:  Bupivacaine 0.25% WITH epi   Injection procedure:  Anatomic landmarks identified, incremental injection, negative aspiration for blood, anatomic landmarks palpated and introduced needle   Paresthesia:  None Post-procedure details:    Dressing:  None   Outcome:   Pain relieved   Procedure completion:  Tolerated     Medications Ordered in ED Medications  bupivacaine (PF) (MARCAINE) 0.25 % injection 10 mL (10 mLs Infiltration Given 05/14/23 0802)  oxyCODONE-acetaminophen (PERCOCET/ROXICET) 5-325 MG per tablet 1 tablet (1 tablet Oral Given 05/14/23 0756)  amoxicillin-clavulanate (AUGMENTIN) 875-125 MG per tablet 1 tablet (1 tablet Oral Given 05/14/23 0756)    ED Course/ Medical Decision Making/ A&P                                 Medical Decision Making Risk Prescription drug management.    This an overall well-appearing 48 y.o. male who presents with concern for dental pain.  Physical exam reveals cavities, broken teeth in mouth on lower left.  Patient with redness, gum swelling without evidence of gum abscess, periapical abscess, PTA, uvula deviation, pharyngitis, epiglottitis, dysphonia, stridor.  Patient without difficulty swallowing.  No systemic fever, chills.  Given patient's symptoms think it is reasonable to treat with antibiotics.  Encouraged ibuprofen, Tylenol, Orajel, ice for pain control. Considered stronger pain control with percocet x 1.  Will provide very short prescription for pain medicine to use since patient does have a dental appointment scheduled already.  Given significant pain in the emergency department dental block performed, patient tolerated without difficulty.  Encouraged urgent dental follow-up, dental resource guide provided.  Provided Augmentin for antibiotic coverage.  Patient discharged in stable condition at this time, return precautions given.  Final Clinical Impression(s) / ED Diagnoses Final diagnoses:  Pain due to dental caries  Dental infection    Rx / DC Orders ED Discharge Orders          Ordered    oxyCODONE-acetaminophen (PERCOCET/ROXICET) 5-325 MG tablet  Every 6 hours PRN        05/14/23 0811              Olene Floss, PA-C 05/14/23 0865    Loetta Rough, MD 05/14/23 [phone]

## 2023-05-14 NOTE — ED Triage Notes (Signed)
Pt c.o left lower dental pain and swelling. States the swelling has been so bad for the past 4 days that he cannot eat and barely open his jaw due to the pain and swelling.

## 2024-02-27 ENCOUNTER — Emergency Department (HOSPITAL_COMMUNITY): Payer: Self-pay

## 2024-02-27 ENCOUNTER — Other Ambulatory Visit: Payer: Self-pay

## 2024-02-27 ENCOUNTER — Emergency Department (HOSPITAL_COMMUNITY)
Admission: EM | Admit: 2024-02-27 | Discharge: 2024-02-28 | Disposition: A | Payer: Self-pay | Attending: Emergency Medicine | Admitting: Emergency Medicine

## 2024-02-27 DIAGNOSIS — K2900 Acute gastritis without bleeding: Secondary | ICD-10-CM | POA: Insufficient documentation

## 2024-02-27 LAB — COMPREHENSIVE METABOLIC PANEL WITH GFR
ALT: 14 U/L (ref 0–44)
AST: 17 U/L (ref 15–41)
Albumin: 3 g/dL — ABNORMAL LOW (ref 3.5–5.0)
Alkaline Phosphatase: 57 U/L (ref 38–126)
Anion gap: 7 (ref 5–15)
BUN: 10 mg/dL (ref 6–20)
CO2: 29 mmol/L (ref 22–32)
Calcium: 8.2 mg/dL — ABNORMAL LOW (ref 8.9–10.3)
Chloride: 99 mmol/L (ref 98–111)
Creatinine, Ser: 1.1 mg/dL (ref 0.61–1.24)
GFR, Estimated: >60 mL/min (ref >60)
Glucose, Bld: 101 mg/dL — ABNORMAL HIGH (ref 70–99)
Potassium: 4.3 mmol/L (ref 3.5–5.1)
Sodium: 135 mmol/L (ref 135–145)
Total Bilirubin: <0.2 mg/dL (ref 0.0–1.2)
Total Protein: 5.3 g/dL — ABNORMAL LOW (ref 6.5–8.1)

## 2024-02-27 LAB — CBC
HCT: 42.8 % (ref 39.0–52.0)
Hemoglobin: 13.8 g/dL (ref 13.0–17.0)
MCH: 22.3 pg — ABNORMAL LOW (ref 26.0–34.0)
MCHC: 32.2 g/dL (ref 30.0–36.0)
MCV: 69.3 fL — ABNORMAL LOW (ref 80.0–100.0)
Platelets: 417 K/uL — ABNORMAL HIGH (ref 150–400)
RBC: 6.18 MIL/uL — ABNORMAL HIGH (ref 4.22–5.81)
RDW: 16.3 % — ABNORMAL HIGH (ref 11.5–15.5)
WBC: 4.6 K/uL (ref 4.0–10.5)
nRBC: 0 % (ref 0.0–0.2)

## 2024-02-27 LAB — URINALYSIS, ROUTINE W REFLEX MICROSCOPIC
Bilirubin Urine: NEGATIVE
Glucose, UA: NEGATIVE mg/dL
Hgb urine dipstick: NEGATIVE
Ketones, ur: NEGATIVE mg/dL
Leukocytes,Ua: NEGATIVE
Nitrite: NEGATIVE
Protein, ur: NEGATIVE mg/dL
Specific Gravity, Urine: 1.023 (ref 1.005–1.030)
pH: 6 (ref 5.0–8.0)

## 2024-02-27 LAB — LIPASE, BLOOD: Lipase: 34 U/L (ref 11–51)

## 2024-02-27 LAB — TROPONIN I (HIGH SENSITIVITY): Troponin I (High Sensitivity): 3 ng/L (ref <18)

## 2024-02-27 MED ORDER — ONDANSETRON 4 MG PO TBDP
4.0000 mg | ORAL_TABLET | Freq: Once | ORAL | Status: AC
  Administered 2024-02-27: 4 mg via ORAL
  Filled 2024-02-27: qty 1

## 2024-02-27 MED ORDER — ACETAMINOPHEN 325 MG PO TABS
650.0000 mg | ORAL_TABLET | Freq: Once | ORAL | Status: AC
  Administered 2024-02-27: 650 mg via ORAL
  Filled 2024-02-27: qty 2

## 2024-02-27 NOTE — ED Triage Notes (Signed)
 Pt states that he has right rib pain x 1 week. Denies injury

## 2024-02-27 NOTE — ED Notes (Signed)
 Patient started to complain of generalized chest pain - unable to describe pain. Patient brought to triage to perform EKG and troponin. Chest xray ordered.

## 2024-02-27 NOTE — ED Triage Notes (Signed)
 Patient reports bilateral rib cage pain for 1 week. Patient also reports abdominal pain and black stools for 2 weeks associated with nausea and fatigue. Denies urinary symptoms. States symptoms are exacerbated after eating/drinking.

## 2024-02-27 NOTE — ED Notes (Signed)
 Pt presents to desk complaining of chest pain. Pt's vital obtained and an EKG ordered.

## 2024-02-28 MED ORDER — ALUM & MAG HYDROXIDE-SIMETH 200-200-20 MG/5ML PO SUSP
30.0000 mL | Freq: Once | ORAL | Status: AC
  Administered 2024-02-28: 30 mL via ORAL
  Filled 2024-02-28: qty 30

## 2024-02-28 MED ORDER — LIDOCAINE VISCOUS HCL 2 % MT SOLN
15.0000 mL | Freq: Once | OROMUCOSAL | Status: AC
  Administered 2024-02-28: 15 mL via ORAL
  Filled 2024-02-28: qty 15

## 2024-02-28 MED ORDER — PROCHLORPERAZINE EDISYLATE 10 MG/2ML IJ SOLN
10.0000 mg | Freq: Once | INTRAMUSCULAR | Status: AC
  Administered 2024-02-28: 10 mg via INTRAMUSCULAR
  Filled 2024-02-28: qty 2
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
